# Patient Record
Sex: Female | Born: 1990 | Race: White | Hispanic: No
Health system: Midwestern US, Community
[De-identification: ages and names within clinical notes are randomized; demographics above are authoritative.]

## PROBLEM LIST (undated history)

## (undated) DIAGNOSIS — M5412 Radiculopathy, cervical region: Principal | ICD-10-CM

---

## 2016-07-11 ENCOUNTER — Encounter (HOSPITAL_BASED_OUTPATIENT_CLINIC_OR_DEPARTMENT_OTHER): Payer: Self-pay | Admitting: *Deleted

## 2016-07-11 ENCOUNTER — Emergency Department (HOSPITAL_BASED_OUTPATIENT_CLINIC_OR_DEPARTMENT_OTHER)
Admission: EM | Admit: 2016-07-11 | Discharge: 2016-07-12 | Disposition: A | Discharge status: Home / Self Care | Payer: BLUE CROSS/BLUE SHIELD | Attending: Emergency Medicine | Admitting: Emergency Medicine

## 2016-07-11 ENCOUNTER — Emergency Department (HOSPITAL_BASED_OUTPATIENT_CLINIC_OR_DEPARTMENT_OTHER): Payer: BLUE CROSS/BLUE SHIELD

## 2016-07-11 DIAGNOSIS — J40 Bronchitis, not specified as acute or chronic: Secondary | ICD-10-CM | POA: Diagnosis not present

## 2016-07-11 DIAGNOSIS — R1110 Vomiting, unspecified: Secondary | ICD-10-CM | POA: Diagnosis present

## 2016-07-11 LAB — URINALYSIS, ROUTINE W REFLEX MICROSCOPIC
BILIRUBIN URINE: NEGATIVE
GLUCOSE, UA: NEGATIVE mg/dL
Ketones, ur: >80 mg/dL — AB
Leukocytes, UA: NEGATIVE
Nitrite: NEGATIVE
PH: 8.5 — AB (ref 5.0–8.0)
Protein, ur: NEGATIVE mg/dL
SPECIFIC GRAVITY, URINE: 1.022 (ref 1.005–1.030)

## 2016-07-11 LAB — URINE MICROSCOPIC-ADD ON

## 2016-07-11 LAB — PREGNANCY, URINE: Preg Test, Ur: NEGATIVE

## 2016-07-11 MED ORDER — BENZONATATE 100 MG PO CAPS
100.0000 mg | ORAL_CAPSULE | Freq: Once | ORAL | Status: AC
  Administered 2016-07-11: 100 mg via ORAL
  Filled 2016-07-11: qty 1

## 2016-07-11 MED ORDER — SODIUM CHLORIDE 0.9 % IV BOLUS (SEPSIS)
1000.0000 mL | Freq: Once | INTRAVENOUS | Status: AC
  Administered 2016-07-11: 1000 mL via INTRAVENOUS

## 2016-07-11 MED ORDER — AZITHROMYCIN 250 MG PO TABS: ORAL_TABLET | ORAL | 0 refills | Status: AC

## 2016-07-11 MED ORDER — ONDANSETRON HCL 4 MG/2ML IJ SOLN
INTRAMUSCULAR | Status: DC
Start: 2016-07-11 — End: 2016-07-12
  Filled 2016-07-11: qty 2

## 2016-07-11 MED ORDER — ONDANSETRON HCL 4 MG/2ML IJ SOLN: 4.0000 mg | Freq: Once | INTRAMUSCULAR | Status: DC

## 2016-07-11 MED ORDER — BENZONATATE 100 MG PO CAPS: 100.0000 mg | ORAL_CAPSULE | Freq: Three times a day (TID) | ORAL | 0 refills | Status: AC

## 2016-07-11 MED ORDER — AZITHROMYCIN 250 MG PO TABS
500.0000 mg | ORAL_TABLET | Freq: Once | ORAL | Status: AC
Start: 2016-07-11 — End: 2016-07-11
  Administered 2016-07-11: 500 mg via ORAL
  Filled 2016-07-11: qty 2

## 2016-07-11 NOTE — Discharge Instructions (Signed)
Please read and follow all provided instructions.  Your diagnoses today include:  1. Bronchitis    Tests performed today include: Vital signs. See below for your results today.   Medications prescribed:  Take as prescribed   Home care instructions:  Follow any educational materials contained in this packet.  Follow-up instructions: Please follow-up with your primary care provider for further evaluation of symptoms and treatment   Return instructions:  Please return to the Emergency Department if you do not get better, if you get worse, or new symptoms OR  - Fever (temperature greater than 101.52F)  - Bleeding that does not stop with holding pressure to the area    -Severe pain (please note that you may be more sore the day after your accident)  - Chest Pain  - Difficulty breathing  - Severe nausea or vomiting  - Inability to tolerate food and liquids  - Passing out  - Skin becoming red around your wounds  - Change in mental status (confusion or lethargy)  - New numbness or weakness    Please return if you have any other emergent concerns.  Additional Information:  Your vital signs today were: BP 118/56 (BP Location: Left Arm)    Pulse 93    Temp 99.7 F (37.6 C) (Oral)    Resp 20    Ht 5\' 5"  (1.651 m)    Wt 62.6 kg    SpO2 100%    BMI 22.96 kg/m  If your blood pressure (BP) was elevated above 135/85 this visit, please have this repeated by your doctor within one month. ---------------

## 2016-07-11 NOTE — ED Triage Notes (Signed)
Vomiting x 4 weeks. Recent ear infection that she took an antibiotic. Body aches, chills, fever, sore throat and post nasal drip.

## 2016-07-11 NOTE — ED Provider Notes (Signed)
[facility] Provider Note   CSN: [id number] Arrival date & time: 07/11/16  2053  By signing my name below, I, Phillis Haggis, attest that this documentation has been prepared under the direction and in the presence of  Audry Pili, PA-C. Electronically Signed: Phillis Haggis, ED Scribe. 07/11/16. 9:57 PM.  First Provider Contact:  None     History   Chief Complaint Chief Complaint  Patient presents with  . Emesis   The history is provided by the patient. No language interpreter was used.   HPI Comments: Robin Hendrix is a 25 y.o. female who presents to the Emergency Department complaining of intermittent vomiting onset 4 weeks ago, worsening today. She reports associated chills, fever, generalized myalgias, burning sensation in the throat, congestion, abdominal pain, productive cough with yellow sputum, and postnasal drip. Pt reports that she had an ear infection and URI symptoms 4 weeks ago that were treated with antibiotics. She reports that vomiting is post-tussive emesis with the productive cough. She states that she has not been able to tolerate water today. She denies rhinorrhea or decreased appetite.   History reviewed. No pertinent past medical history.  There are no active problems to display for this patient.   History reviewed. No pertinent surgical history.  OB History    No data available     Home Medications    Prior to Admission medications   Not on File    Family History No family history on file.  Social History Social History  Substance Use Topics  . Smoking status: Never Smoker  . Smokeless tobacco: Never Used  . Alcohol use No     Allergies   Review of patient's allergies indicates no known allergies.   Review of Systems Review of Systems A complete 10 system review of systems was obtained and all systems are negative except as noted in the HPI and PMH.   Physical Exam Updated Vital Signs BP 117/64   Pulse 97   Temp 98.1  F (36.7 C) (Oral)   Resp 18   Ht  (1.651 m)   Wt 138 lb (62.6 kg)   SpO2 99%   BMI 22.96 kg/m   Physical Exam  Constitutional: She is oriented to person, place, and time. She appears well-developed and well-nourished.  HENT:  Head: Normocephalic and atraumatic.  Right Ear: Tympanic membrane and ear canal normal.  Left Ear: Tympanic membrane and ear canal normal.  Mouth/Throat: Uvula is midline, oropharynx is clear and moist and mucous membranes are normal.  Eyes: EOM are normal. Pupils are equal, round, and reactive to light.  Neck: Trachea normal and normal range of motion. Neck supple. No Brudzinski's sign and no Kernig's sign noted.  Full ROM without difficulty. Phonating well. No Trismus. No uvula deviation.   Cardiovascular: Normal rate, regular rhythm and normal heart sounds.  Exam reveals no gallop and no friction rub.   No murmur heard. Pulmonary/Chest: Effort normal and breath sounds normal. She has no wheezes.  Abdominal: Soft. She exhibits no distension. There is no tenderness.  Musculoskeletal: Normal range of motion.  Neurological: She is alert and oriented to person, place, and time.  Skin: Skin is warm and dry.  Psychiatric: She has a normal mood and affect. Her behavior is normal.  Nursing note and vitals reviewed.  ED Treatments / Results  DIAGNOSTIC STUDIES: Oxygen Saturation is 99% on RA, normal by my interpretation.    COORDINATION OF CARE: 9:54 PM-Discussed treatment plan which includes labs and  chest x-ray with pt at bedside and pt agreed to plan.   Labs (all labs ordered are listed, but only abnormal results are displayed) Labs Reviewed  URINALYSIS, ROUTINE W REFLEX MICROSCOPIC (NOT AT [facility]) - Abnormal; Notable for the following:       Result Value   pH 8.5 (*)    Hgb urine dipstick SMALL (*)    Ketones, ur >80 (*)    All other components within normal limits  URINE MICROSCOPIC-ADD ON - Abnormal; Notable for the following:    Squamous  Epithelial / LPF 0-5 (*)    Bacteria, UA RARE (*)    All other components within normal limits  PREGNANCY, URINE   EKG  EKG Interpretation None      Radiology Dg Chest 2 View  Result Date: 07/11/2016 CLINICAL DATA:  25 year old female with intermittent vomiting and cough. EXAM: CHEST  2 VIEW COMPARISON:  None. FINDINGS: The heart size and mediastinal contours are within normal limits. Both lungs are clear. The visualized skeletal structures are unremarkable. IMPRESSION: No active cardiopulmonary disease. Electronically Signed   By: Elgie Collard M.D.   On: 07/11/2016 22:22  Procedures Procedures (including critical care time)  Medications Ordered in ED Medications  sodium chloride 0.9 % bolus 1,000 mL (1,000 mLs Intravenous New Bag/Given 07/11/16 2234)     Initial Impression / Assessment and Plan / ED Course  I have reviewed the triage vital signs and the nursing notes.  Pertinent labs & imaging results that were available during my care of the patient were reviewed by me and considered in my medical decision making (see chart for details).  Clinical Course    Final Clinical Impressions(s) / ED Diagnoses  I have reviewed and evaluated the relevant laboratory values I have reviewed and evaluated the relevant imaging studies.  I have reviewed the relevant previous healthcare records. I obtained HPI from historian.  ED Course:  Assessment: Pt is a 25yF who presents with generalized body aches, fatigue, post-tussive emesis x 4 weeks on and off. On exam, pt in NAD. Nontoxic/nonseptic appearing. VSS. Afebrile. No nuchal rigidity. Full ROM of C spine. No posterior oropharyngeal erythema or exudate. No trismus. No uvula deviation. Lungs CTA. Heart RRR. Abdomen nontender soft. No acute abdomen palpable. Patient does not meet the SIRS or Sepsis criteria.  On repeat exam patient does not have a surgical abdomen and there are no peritoneal signs. No indication of appendicitis, bowel  obstruction, bowel perforation, cholecystitis, diverticulitis, PID or ectopic pregnancy. Labs unremarkable. CXR unremarkable. Given fluids in ED with improvement. Given symptoms and duration of symptoms will Rx ABX and Tessalon. Tx as bronchitis. Pt able to tolerate PO. Plan is to DC home with follow up to PCP. At time of discharge, Patient is in no acute distress. Vital Signs are stable. Patient is able to ambulate. Patient able to tolerate PO.    Disposition/Plan:  DC Home Additional Verbal discharge instructions given and discussed with patient.  Pt Instructed to f/u with PCP in the next week for evaluation and treatment of symptoms. Return precautions given Pt acknowledges and agrees with plan  Supervising Physician Melene Plan, DO   Final diagnoses:  Bronchitis   I personally performed the services described in this documentation, which was scribed in my presence. The recorded information has been reviewed and is accurate.   New Prescriptions New Prescriptions   No medications on file        Audry Pili, PA-C 07/11/16 2359    Jesusita Oka  Adela Lank, DO 07/12/16 1604

## 2018-01-04 IMAGING — CR DG CHEST 2V
2 series · 2 of 2 positions shown · non-contrast
Comparison: None.

CLINICAL DATA: 25-year-old female with intermittent vomiting and
cough.

EXAM:
CHEST  2 VIEW

[w chest pa]
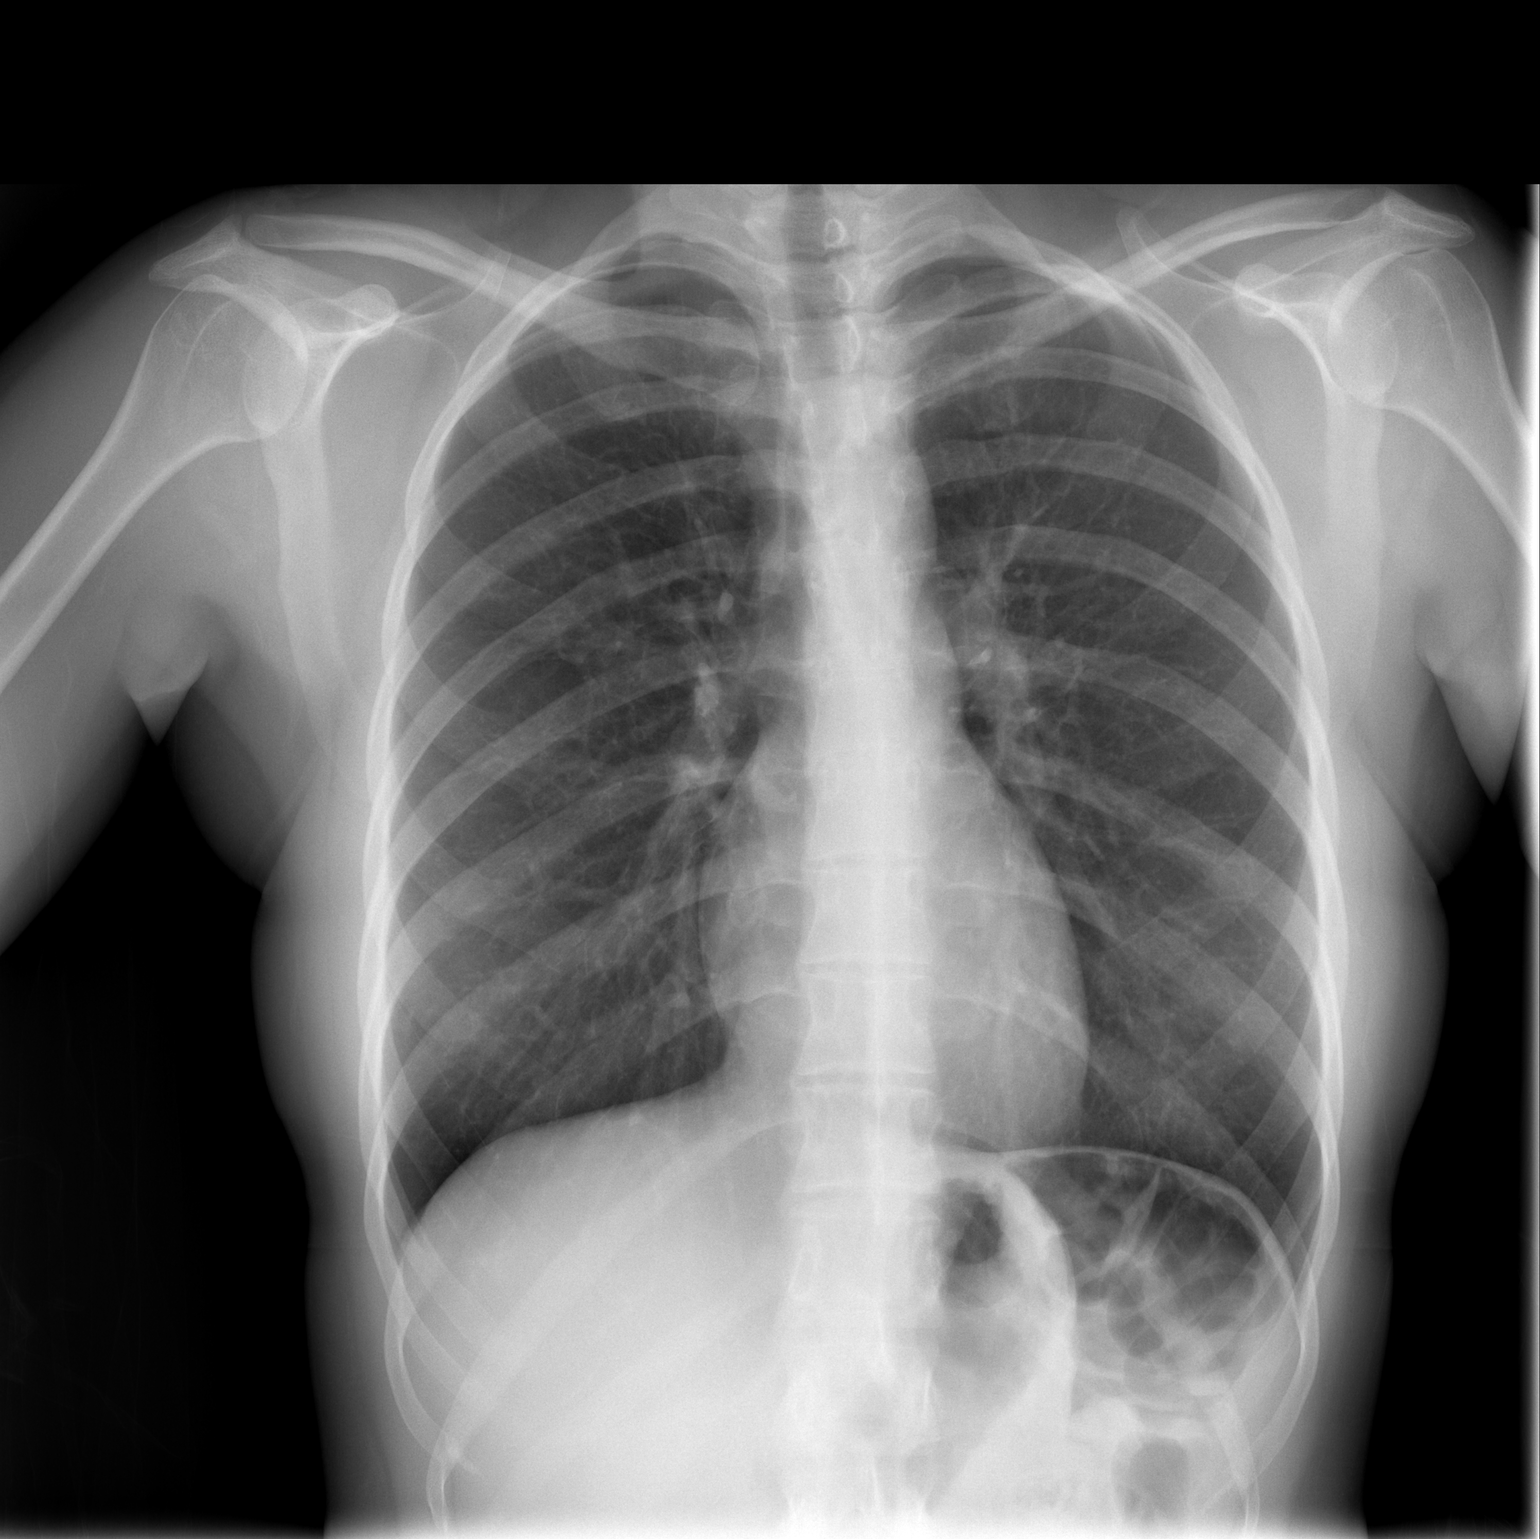

[w chest lat]
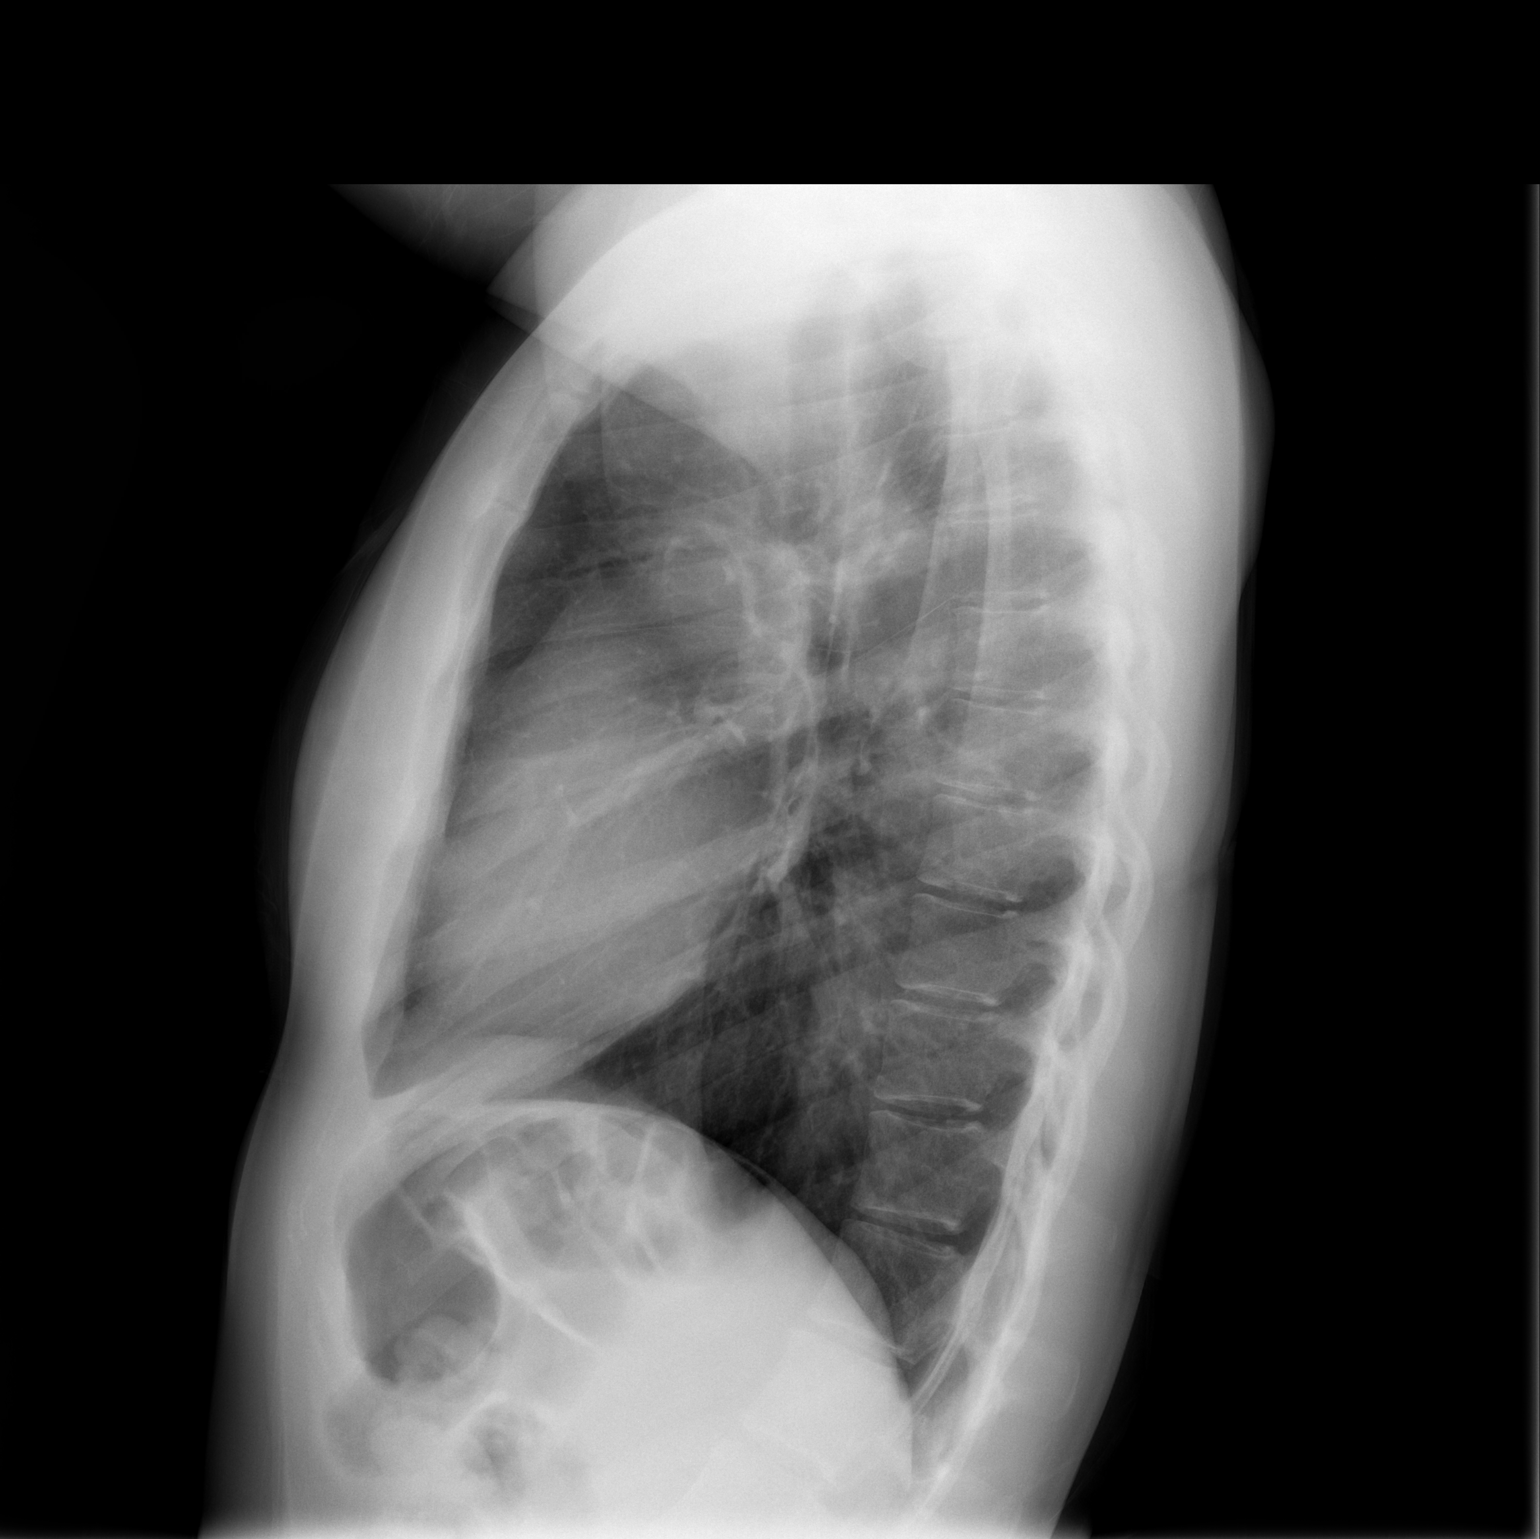

[2 of 2 positions shown; findings below may reference images not displayed]

FINDINGS: The heart size and mediastinal contours are within normal limits.
Both lungs are clear. The visualized skeletal structures are
unremarkable.
IMPRESSION: No active cardiopulmonary disease.

## 2022-07-18 NOTE — Telephone Encounter (Signed)
Pt would like a sooner appt within the next week or two to be seen for [facility]. Please assist. Pt is scheduled for np appt on 09/20.

## 2022-09-04 ENCOUNTER — Encounter: Attending: Obstetrics & Gynecology

## 2024-02-25 ENCOUNTER — Ambulatory Visit
Admit: 2024-02-25 | Discharge: 2024-02-25 | Payer: BLUE CROSS/BLUE SHIELD | Attending: Family Medicine | Primary: Family Medicine

## 2024-02-25 VITALS — BP 94/66 | HR 70 | Ht 62.0 in | Wt 143.0 lb

## 2024-02-25 DIAGNOSIS — R519 Headache, unspecified: Secondary | ICD-10-CM

## 2024-02-25 NOTE — Progress Notes (Signed)
 Annette Fernandez (DOB:  1991-03-31) is a 33 y.o. female, here today to establish care:   New Patient (Body pain ongoing for a week. Really sharp headaches. Felt disoriented. Thought she slept wrong but it went on.//About 8 months ago had change in stools. They are really long. )        Assessment & Plan   ASSESSMENT/PLAN:  1. Acute nonintractable headache, unspecified headache type  2. Seasonal allergies  3. Change in stool habits      Headache possibly 2/2 acute URI w/ allergic sinusitis also contributing. Recommended daily flonase w/ oral antihistamine. Follow-up in 1 week to ensure improvement.     Reassured that the stool pattern sounds normal.           Return in about 1 week (around 03/03/2024) for headache (virtual) .           SUBJECTIVE/OBJECTIVE:  HPI       Headache/Neck pain. 1 week ago, had sudden onset of pain in the R ear, R occiput, and headache. Pain then began to radiate into the R trapezius and B shoulders. Had some dizziness as well. Had low grade fever last night. Dayquil helped to reduce the sx's. Headache pain located over frontal area on the R side. Does not have migraine headaches. Her roommate recently had a brief episode of fever and neck pain.     Change in bowel habits. For at least 6 months, notes that her stools have been longer than normal. Soft, does not strain. No abdominal pain.       Prior to Admission medications    Not on File       No Known Allergies    History reviewed. No pertinent past medical history.    Past Surgical History:   Procedure Laterality Date    WISDOM TOOTH EXTRACTION           Family History   Problem Relation Age of Onset    Pancreatic Cancer Paternal Grandmother        Social History     Tobacco Use    Smoking status: Never    Smokeless tobacco: Never   Vaping Use    Vaping status: Never Used   Substance Use Topics    Alcohol use: Never          Review of Systems     Objective       Vitals:    02/25/24 1324   BP: 94/66   Pulse: 70   SpO2: 99%   Weight: 64.9 kg  (143 lb)   Height: 1.575 m (5\' 2" )        BP Readings from Last 3 Encounters:   02/25/24 94/66     Wt Readings from Last 3 Encounters:   02/25/24 64.9 kg (143 lb)       GEN-No acute distress.  HEENT-Neck is supple, moist mucosa. Mild serous effusion B ears.   CV-Regular rate and rhythm. No murmurs, gallops, or rubs.   LUNGS-Normal respiratory effort. Clear to auscultation bilaterally. Good air movement.  EXT-No edema.   PSYCH-Normal mood and affect.            No results found for any previous visit.      No results found for this visit on 02/25/24.         These notes were produced using voice recognition technology, and some typographical errors may be present, despite our best efforts with proofreading.  An electronic signature was used to authenticate this note.    --Geni Bers, MD

## 2024-03-03 ENCOUNTER — Encounter: Payer: BLUE CROSS/BLUE SHIELD | Attending: Family Medicine | Primary: Family Medicine

## 2024-08-27 ENCOUNTER — Ambulatory Visit
Admit: 2024-08-27 | Discharge: 2024-08-27 | Payer: BLUE CROSS/BLUE SHIELD | Attending: Family Medicine | Primary: Family Medicine

## 2024-08-27 ENCOUNTER — Ambulatory Visit: Payer: BLUE CROSS/BLUE SHIELD | Primary: Family Medicine

## 2024-08-27 VITALS — BP 102/72 | HR 60 | Ht 62.0 in | Wt 142.9 lb

## 2024-08-27 DIAGNOSIS — M542 Cervicalgia: Principal | ICD-10-CM

## 2024-08-27 MED ORDER — PREDNISONE 20 MG PO TABS
20 | ORAL_TABLET | Freq: Every day | ORAL | 0 refills | Status: AC
Start: 2024-08-27 — End: 2024-09-01

## 2024-09-24 ENCOUNTER — Telehealth
Admit: 2024-09-24 | Discharge: 2024-09-24 | Payer: BLUE CROSS/BLUE SHIELD | Attending: Family Medicine | Primary: Family Medicine

## 2024-09-24 DIAGNOSIS — M542 Cervicalgia: Principal | ICD-10-CM

## 2024-09-24 MED ORDER — PREDNISONE 20 MG PO TABS
20 | ORAL_TABLET | ORAL | 0 refills | 7.00000 days | Status: AC
Start: 2024-09-24 — End: 2024-10-06

## 2024-09-24 NOTE — Progress Notes (Signed)
 "    Annette Fernandez, was evaluated through a synchronous (real-time) audio-video encounter. The patient (or guardian if applicable) is aware that this is a billable service, which includes applicable co-pays. This Virtual Visit was conducted with patient's (and/or legal guardian's) consent. Patient identification was verified, and a caregiver was present when appropriate.   The patient was located at Home: [address]  [place] [address]  Provider was located at [employer] (Appt Dept): [address]  [address]  [place],  [place] [address]  Confirm you are appropriately licensed, registered, or certified to deliver care in the state where the patient is located as indicated above. If you are not or unsure, please re-schedule the visit: Yes, I confirm.     Annette Fernandez (DOB:  1991-02-02) is a Established patient, presenting virtually for evaluation of the following:      Below is the assessment and plan developed based on review of pertinent history, physical exam, labs, studies, and medications.     Assessment & Plan  Neck pain on left side       Orders:    predniSONE  (DELTASONE ) 20 MG tablet; Take 2 tablets by mouth 2 times daily for 4 days, THEN 1 tablet 2 times daily for 4 days, THEN 0.5 tablets 2 times daily for 4 days.    MRI CERVICAL SPINE WO CONTRAST; Future    Cervical radiculopathy       Orders:    predniSONE  (DELTASONE ) 20 MG tablet; Take 2 tablets by mouth 2 times daily for 4 days, THEN 1 tablet 2 times daily for 4 days, THEN 0.5 tablets 2 times daily for 4 days.    MRI CERVICAL SPINE WO CONTRAST; Future    Dizziness       Orders:    External Referral To Otolaryngology (ENT)        Prednisone  taper. Having neck pain radiating into L arm since March. Pain failed to resolve with prednisone . Checking MRI C  spine. Provided home exercises.     Provided work note recommending avoidance of office spaces in which loud noises and vibrations occur. Placing referral to ENT to further evaluate chronic  vertigo.     Return in about 2 weeks (around 10/08/2024) for MRI, dizziness .       Subjective   HPI    Sx's improved with steroid but have begun to recur. Still having pain radiating into L arm. Wasn't able to complete PT due to high cost of PT.     Has had intermittent dizziness for > 1 yr. 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     Review of Systems       Objective   Patient-Reported Vitals  No data recorded     Physical Exam  [INSTRUCTIONS:  [x]  Indicates a positive item  []  Indicates a negative item  -- DELETE ALL ITEMS NOT EXAMINED]    Constitutional: [x]  Appears well-developed and well-nourished [x]  No apparent distress      []  Abnormal -     Mental status: [x]  Alert and awake  [x]  Oriented to person/place/time [x]  Able to follow commands    []  Abnormal -     Eyes:   EOM    [x]   Normal    []  Abnormal -   Sclera  [x]   Normal    []  Abnormal -          Discharge [x]   None visible   []   Abnormal -     HENT: [x]  Normocephalic, atraumatic  []  Abnormal -   [x]  Mouth/Throat: Mucous membranes are moist    External Ears [x]  Normal  []  Abnormal -    Neck: [x]  No visualized mass []  Abnormal -     Pulmonary/Chest: [x]  Respiratory effort normal   [x]  No visualized signs of difficulty breathing or respiratory distress        []  Abnormal -      Musculoskeletal:   [x]  Normal gait with no signs of ataxia         [x]  Normal range of motion of neck        []  Abnormal -     Neurological:        [x]  No Facial Asymmetry (Cranial nerve 7 motor function) (limited exam due to video visit)          [x]  No gaze palsy        []  Abnormal -          Skin:        [x]  No significant exanthematous lesions or discoloration noted on facial skin         []  Abnormal -            Psychiatric:       [x]  Normal Affect []  Abnormal -        [x]  No Hallucinations    Other pertinent observable physical exam findings:-             --Sherlean CHRISTELLA Bride, MD         "

## 2024-10-22 ENCOUNTER — Encounter: Attending: Family Medicine | Primary: Family Medicine

## 2024-10-23 NOTE — Telephone Encounter (Signed)
"  Please offer Annette Fernandez a visit on Monday to evaluate her concern if it's still an issue.   "

## 2024-10-28 NOTE — Telephone Encounter (Signed)
"  Advised patient per PCP request, any medication issues require an appointment for him to rectify any concerns or issue a patient may have. Advised patient to call us  at [phone] to schedule or simply schedule at her convenience thru MyChart.   "

## 2024-10-29 ENCOUNTER — Encounter: Payer: BLUE CROSS/BLUE SHIELD | Attending: Family Medicine | Primary: Family Medicine

## 2024-11-19 ENCOUNTER — Telehealth

## 2024-11-19 NOTE — Telephone Encounter (Signed)
"  Pt called and requested that her MRI referral be sent to [facility]. I let her know that I would get it sent over for her right now after I have Dr. Erica fix it.   "

## 2024-11-19 NOTE — Telephone Encounter (Signed)
"  Creating new order now. Thanks   "

## 2024-11-22 NOTE — Telephone Encounter (Signed)
"  Message has been read by the patient.   "

## 2024-11-23 NOTE — Telephone Encounter (Signed)
"  Advised patient that can have a PA completed once faxed over. Advised patient of fax number [phone] to have [facility] fax the PA form over.   "

## 2024-11-26 NOTE — Telephone Encounter (Signed)
"  Pt called back and stated that we just need to send the order to her insurance company with the reason why she needs an MRI and state that she has already done stretches for 4 weeks and it is still not resolved and that is why she is needing the MRI because Dr. Erica believes that she has a possible herniated disc that is causing a pinched nerve.   "

## 2024-11-26 NOTE — Telephone Encounter (Signed)
"  Called and LVM stating that I have not seen the paper yet however, I would be on the look out for the pre-authorization paper and I let the patient know that I sent Dr. Erica a message too about this to see if he knew anything about it.   "

## 2024-11-26 NOTE — Telephone Encounter (Signed)
"  Pt called on 11/26/24 and stated that she was wondering if we got the pre-authorization form. I let her know that I will look and then I will call her back to let her know whether we have  it or no.     "

## 2024-11-30 NOTE — Telephone Encounter (Signed)
"  Spoke with patient and advised that PCP is unable to complete a pre-prior authorization for an MRI. Advised patient per Dr. Erica that he states that he can either refer her to a Spine doctor within [facility] network since she carries [employer] and at that point they can order the MRI and completed the prior auth. Also advised patient that the other options would be to go to [facility] or [facility]  to MRI. Per PCP request he states to fax order to [facility] and advise them to let patient know what cost would be before setting the appointment. Fax sent per Dr. Erica request and Patient informed.   "

## 2024-11-30 NOTE — Telephone Encounter (Deleted)
"  Email is [email or web address]   "

## 2024-12-22 ENCOUNTER — Inpatient Hospital Stay: Admit: 2024-12-22 | Payer: BLUE CROSS/BLUE SHIELD | Primary: Family Medicine

## 2024-12-22 DIAGNOSIS — M5412 Radiculopathy, cervical region: Principal | ICD-10-CM

## 2024-12-30 NOTE — Telephone Encounter (Addendum)
"  Spoke with patient and advise of below message. Patient states that she saw her results and she has been placed in physical therapy by another provider and she is good to go. Advised that I would let PCP know.                       ----- Message from Dr. 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 There are also some bone spurs at that area. This is creating a little bit of pressure against the spinal   cord, but it appears to be only to a mild degree.     Lets set up a visit to discuss the findings in more detail and develop a plan. Virtual is okay.   ----- Message -----  From: Edwina Bears Incoming Imaging Orders Results  Sent: 12/22/2024  10:36 AM EST  To: Sherlean CHRISTELLA Bride, MD    "
